# Patient Record
Sex: Female | Born: 1980 | Race: Black or African American | Hispanic: No | Marital: Single
Health system: Southern US, Community
[De-identification: ages and names within clinical notes are randomized; demographics above are authoritative.]

---

## 2020-07-30 ENCOUNTER — Emergency Department (HOSPITAL_COMMUNITY)
Admission: EM | Admit: 2020-07-30 | Discharge: 2020-07-30 | Disposition: A | Discharge status: Home / Self Care | Payer: Self-pay | Attending: Emergency Medicine | Admitting: Emergency Medicine

## 2020-07-30 ENCOUNTER — Emergency Department (HOSPITAL_COMMUNITY): Payer: Self-pay

## 2020-07-30 ENCOUNTER — Encounter (HOSPITAL_COMMUNITY): Payer: Self-pay | Admitting: Emergency Medicine

## 2020-07-30 ENCOUNTER — Other Ambulatory Visit: Payer: Self-pay

## 2020-07-30 DIAGNOSIS — Y900 Blood alcohol level of less than 20 mg/100 ml: Secondary | ICD-10-CM | POA: Insufficient documentation

## 2020-07-30 DIAGNOSIS — Z7982 Long term (current) use of aspirin: Secondary | ICD-10-CM | POA: Insufficient documentation

## 2020-07-30 DIAGNOSIS — R531 Weakness: Secondary | ICD-10-CM | POA: Insufficient documentation

## 2020-07-30 DIAGNOSIS — R4781 Slurred speech: Secondary | ICD-10-CM | POA: Insufficient documentation

## 2020-07-30 DIAGNOSIS — R2990 Unspecified symptoms and signs involving the nervous system: Secondary | ICD-10-CM

## 2020-07-30 DIAGNOSIS — I639 Cerebral infarction, unspecified: Secondary | ICD-10-CM | POA: Insufficient documentation

## 2020-07-30 LAB — DIFFERENTIAL
Abs Immature Granulocytes: 0.01 10*3/uL (ref 0.00–0.07)
Basophils Absolute: 0 10*3/uL (ref 0.0–0.1)
Basophils Relative: 0 %
Eosinophils Absolute: 0.3 10*3/uL (ref 0.0–0.5)
Eosinophils Relative: 6 %
Immature Granulocytes: 0 %
Lymphocytes Relative: 55 %
Lymphs Abs: 2.9 10*3/uL (ref 0.7–4.0)
Monocytes Absolute: 0.4 10*3/uL (ref 0.1–1.0)
Monocytes Relative: 8 %
Neutro Abs: 1.6 10*3/uL — ABNORMAL LOW (ref 1.7–7.7)
Neutrophils Relative %: 31 %

## 2020-07-30 LAB — COMPREHENSIVE METABOLIC PANEL
ALT: 13 U/L (ref 0–44)
AST: 30 U/L (ref 15–41)
Albumin: 3.4 g/dL — ABNORMAL LOW (ref 3.5–5.0)
Alkaline Phosphatase: 35 U/L — ABNORMAL LOW (ref 38–126)
Anion gap: 6 (ref 5–15)
BUN: 9 mg/dL (ref 6–20)
CO2: 24 mmol/L (ref 22–32)
Calcium: 8.8 mg/dL — ABNORMAL LOW (ref 8.9–10.3)
Chloride: 105 mmol/L (ref 98–111)
Creatinine, Ser: 0.75 mg/dL (ref 0.44–1.00)
GFR, Estimated: >60 mL/min (ref >60)
Glucose, Bld: 92 mg/dL (ref 70–99)
Potassium: 4.4 mmol/L (ref 3.5–5.1)
Sodium: 135 mmol/L (ref 135–145)
Total Bilirubin: 1.1 mg/dL (ref 0.3–1.2)
Total Protein: 6.7 g/dL (ref 6.5–8.1)

## 2020-07-30 LAB — I-STAT CHEM 8, ED
BUN: 9 mg/dL (ref 6–20)
Calcium, Ion: 1.11 mmol/L — ABNORMAL LOW (ref 1.15–1.40)
Chloride: 104 mmol/L (ref 98–111)
Creatinine, Ser: 0.7 mg/dL (ref 0.44–1.00)
Glucose, Bld: 90 mg/dL (ref 70–99)
HCT: 35 % — ABNORMAL LOW (ref 36.0–46.0)
Hemoglobin: 11.9 g/dL — ABNORMAL LOW (ref 12.0–15.0)
Potassium: 4.7 mmol/L (ref 3.5–5.1)
Sodium: 138 mmol/L (ref 135–145)
TCO2: 26 mmol/L (ref 22–32)

## 2020-07-30 LAB — CBC
HCT: 34.1 % — ABNORMAL LOW (ref 36.0–46.0)
Hemoglobin: 10.7 g/dL — ABNORMAL LOW (ref 12.0–15.0)
MCH: 29.5 pg (ref 26.0–34.0)
MCHC: 31.4 g/dL (ref 30.0–36.0)
MCV: 93.9 fL (ref 80.0–100.0)
Platelets: 286 10*3/uL (ref 150–400)
RBC: 3.63 MIL/uL — ABNORMAL LOW (ref 3.87–5.11)
RDW: 12.9 % (ref 11.5–15.5)
WBC: 5.3 10*3/uL (ref 4.0–10.5)
nRBC: 0 % (ref 0.0–0.2)

## 2020-07-30 LAB — I-STAT BETA HCG BLOOD, ED (MC, WL, AP ONLY): I-stat hCG, quantitative: <5 m[IU]/mL (ref <5)

## 2020-07-30 LAB — CBG MONITORING, ED: Glucose-Capillary: 91 mg/dL (ref 70–99)

## 2020-07-30 LAB — APTT: aPTT: 26 seconds (ref 24–36)

## 2020-07-30 LAB — ETHANOL: Alcohol, Ethyl (B): <10 mg/dL (ref <10)

## 2020-07-30 LAB — PROTIME-INR
INR: 1 (ref 0.8–1.2)
Prothrombin Time: 13.4 seconds (ref 11.4–15.2)

## 2020-07-30 MED ORDER — PROCHLORPERAZINE EDISYLATE 10 MG/2ML IJ SOLN
10.0000 mg | Freq: Once | INTRAMUSCULAR | Status: AC
  Administered 2020-07-30: 10 mg via INTRAVENOUS
  Filled 2020-07-30: qty 2

## 2020-07-30 MED ORDER — DIPHENHYDRAMINE HCL 50 MG/ML IJ SOLN
12.5000 mg | Freq: Once | INTRAMUSCULAR | Status: AC
  Administered 2020-07-30: 12.5 mg via INTRAVENOUS
  Filled 2020-07-30: qty 1

## 2020-07-30 MED ORDER — SODIUM CHLORIDE 0.9 % IV BOLUS
1000.0000 mL | Freq: Once | INTRAVENOUS | Status: AC
  Administered 2020-07-30: 1000 mL via INTRAVENOUS

## 2020-07-30 NOTE — ED Notes (Signed)
Pt verbalizes understanding of discharge instructions. Opportunity for questions and answers were provided. Pt discharged from the ED.   ?

## 2020-07-30 NOTE — ED Provider Notes (Signed)
[facility] EMERGENCY DEPARTMENT Provider Note   CSN: [id number] Arrival date & time: 07/30/20  1247  An emergency department physician performed an initial assessment on this suspected stroke patient at 56.  History Chief Complaint  Patient presents with   Code Stroke    Sarah Wang is a 40 y.o. female.  Patient here as a code stroke.  Left-sided weakness, speech issues.  History of stroke with left-sided deficits.  Recent admission at outside hospital for the same.  Code stroke upon arrival.  The history is provided by the patient.  Neurologic Problem This is a new problem. The current episode started less than 1 hour ago. The problem occurs constantly. The problem has not changed since onset.Pertinent negatives include no chest pain, no abdominal pain, no headaches and no shortness of breath. Nothing aggravates the symptoms. Nothing relieves the symptoms. She has tried nothing for the symptoms.      History reviewed. No pertinent past medical history.  There are no problems to display for this patient.   History reviewed. No pertinent surgical history.   OB History   No obstetric history on file.     No family history on file.     Home Medications Prior to Admission medications   Medication Sig Start Date End Date Taking? Authorizing Provider  aspirin 81 MG chewable tablet Chew 81 mg by mouth daily. 07/16/20 10/14/20 Yes [provider]  Cholecalciferol 25 MCG (1000 UT) tablet Take 1,000 Units by mouth daily. 06/30/20 09/28/20 Yes [provider]  cyanocobalamin 1000 MCG tablet Take 1,000 mcg by mouth daily. 06/30/20 09/28/20 Yes [provider]  ferrous sulfate 325 (65 FE) MG tablet Take 325 mg by mouth daily with breakfast. 06/30/20 09/28/20 Yes [provider]  simvastatin (ZOCOR) 20 MG tablet Take 20 mg by mouth at bedtime. 07/16/20 10/14/20 Yes [provider]  cyclobenzaprine (FLEXERIL) 5 MG tablet Take  5 mg by mouth 3 (three) times daily as needed for muscle spasms. Patient not taking: Reported on 07/30/2020 06/24/20   [provider]    Allergies    Reglan [metoclopramide] and Percocet [oxycodone-acetaminophen]  Review of Systems   Review of Systems  Constitutional:  Negative for chills and fever.  HENT:  Negative for ear pain and sore throat.   Eyes:  Negative for pain and visual disturbance.  Respiratory:  Negative for cough and shortness of breath.   Cardiovascular:  Negative for chest pain and palpitations.  Gastrointestinal:  Negative for abdominal pain and vomiting.  Genitourinary:  Negative for dysuria and hematuria.  Musculoskeletal:  Negative for arthralgias and back pain.  Skin:  Negative for color change and rash.  Neurological:  Positive for speech difficulty and weakness. Negative for dizziness, tremors, seizures, syncope, facial asymmetry, light-headedness, numbness and headaches.  All other systems reviewed and are negative.  Physical Exam Updated Vital Signs BP 108/83   Pulse 71   Temp 98.3 F (36.8 C) (Oral)   Resp 14   Ht 5\' 3"  (1.6 m)   Wt 63 kg   SpO2 99%   BMI 24.62 kg/m   Physical Exam Vitals and nursing note reviewed.  Constitutional:      General: She is not in acute distress.    Appearance: She is well-developed. She is not ill-appearing.  HENT:     Head: Normocephalic and atraumatic.     Right Ear: Tympanic membrane normal.     Left Ear: Tympanic membrane normal.     Nose:  Nose normal.     Mouth/Throat:     Mouth: Mucous membranes are moist.  Eyes:     Extraocular Movements: Extraocular movements intact.     Conjunctiva/sclera: Conjunctivae normal.     Pupils: Pupils are equal, round, and reactive to light.  Cardiovascular:     Rate and Rhythm: Normal rate and regular rhythm.     Pulses: Normal pulses.     Heart sounds: Normal heart sounds. No murmur heard. Pulmonary:     Effort: Pulmonary effort is normal. No respiratory  distress.     Breath sounds: Normal breath sounds.  Abdominal:     Palpations: Abdomen is soft.     Tenderness: There is no abdominal tenderness.  Musculoskeletal:        General: No tenderness. Normal range of motion.     Cervical back: Normal range of motion and neck supple.  Skin:    General: Skin is warm and dry.     Capillary Refill: Capillary refill takes less than 2 seconds.  Neurological:     General: No focal deficit present.     Mental Status: She is alert and oriented to person, place, and time.     Cranial Nerves: No cranial nerve deficit.     Sensory: No sensory deficit.     Comments: Poor effort given on exam but left-sided weakness compared to the right, slightly slurred speech, no obvious facial droop    ED Results / Procedures / Treatments   Labs (all labs ordered are listed, but only abnormal results are displayed) Labs Reviewed  CBC - Abnormal; Notable for the following components:      Result Value   RBC 3.63 (*)    Hemoglobin 10.7 (*)    HCT 34.1 (*)    All other components within normal limits  DIFFERENTIAL - Abnormal; Notable for the following components:   Neutro Abs 1.6 (*)    All other components within normal limits  COMPREHENSIVE METABOLIC PANEL - Abnormal; Notable for the following components:   Calcium 8.8 (*)    Albumin 3.4 (*)    Alkaline Phosphatase 35 (*)    All other components within normal limits  I-STAT CHEM 8, ED - Abnormal; Notable for the following components:   Calcium, Ion 1.11 (*)    Hemoglobin 11.9 (*)    HCT 35.0 (*)    All other components within normal limits  RESP PANEL BY RT-PCR (FLU A&B, COVID) ARPGX2  ETHANOL  PROTIME-INR  APTT  RAPID URINE DRUG SCREEN, HOSP PERFORMED  URINALYSIS, ROUTINE W REFLEX MICROSCOPIC  CBG MONITORING, ED  I-STAT BETA HCG BLOOD, ED ([facility], [facility], [facility] ONLY)    EKG EKG Interpretation  Date/Time:  Wednesday July 30 2020 13:30:49 EDT Ventricular Rate:  74 PR Interval:  123 QRS Duration: 97 QT  Interval:  395 QTC Calculation: 439 R Axis:   68 Text Interpretation: Sinus rhythm Confirmed by Virgina Norfolk ([id number]) on 07/30/2020 1:37:32 PM  Radiology MR BRAIN WO CONTRAST  Result Date: 07/30/2020 CLINICAL DATA:  Neuro deficit, acute, stroke suspected. Additional provided: Sudden onset slurred speech, prior TIA June 27th, patient reports residual left-sided arm and leg weakness with slurred speech. EXAM: MRI HEAD WITHOUT CONTRAST TECHNIQUE: Multiplanar, multiecho pulse sequences of the brain and surrounding structures were obtained without intravenous contrast. COMPARISON:  Non-contrast head CT 07/30/2020. FINDINGS: Brain: An abbreviated examination consisting of only axial and coronal diffusion-weighted imaging, and axial T2/FLAIR sequence, performed at the provider's request. Focal encephalomalacia is identified. No  significant white matter disease. There is no evidence of acute infarct. No evidence of an intracranial mass or extra-axial fluid collection on the acquired sequences. No midline shift. Vascular: Poorly assessed on the acquired sequences. Skull and upper cervical spine: No appreciable focal suspicious marrow lesion. Sinuses/Orbits: Unremarkable IMPRESSION: Tailored examination consisting of only diffusion-weighted and axial T2/FLAIR sequences at the provider's request. No evidence of acute infarct. Electronically Signed   By: Jackey Loge DO   On: 07/30/2020 13:38   CT HEAD CODE STROKE WO CONTRAST  Result Date: 07/30/2020 CLINICAL DATA:  Code stroke. Left-sided weakness and slurred speech. EXAM: CT HEAD WITHOUT CONTRAST TECHNIQUE: Contiguous axial images were obtained from the base of the skull through the vertex without intravenous contrast. COMPARISON:  None. FINDINGS: Brain: The brain has normal appearance without evidence of atrophy, old or acute infarction, mass lesion, hemorrhage, hydrocephalus or extra-axial collection. Vascular: No abnormal vascular finding. Skull: Normal  Sinuses/Orbits: Clear/normal Other: None ASPECTS (Alberta Stroke Program Early CT Score) - Ganglionic level infarction (caudate, lentiform nuclei, internal capsule, insula, M1-M3 cortex): 7 - Supraganglionic infarction (M4-M6 cortex): 3 Total score (0-10 with 10 being normal): 10 IMPRESSION: 1. Normal head CT. 2. ASPECTS is 10 3. These results were communicated to Dr. Iver Nestle at 12:59 pm on 07/30/2020 by text page via the [facility] messaging system. Electronically Signed   By: Paulina Fusi M.D.   On: 07/30/2020 12:59    Procedures Procedures   Medications Ordered in ED Medications  sodium chloride 0.9 % bolus 1,000 mL (1,000 mLs Intravenous New Bag/Given 07/30/20 1410)  prochlorperazine (COMPAZINE) injection 10 mg (10 mg Intravenous Given 07/30/20 1410)  diphenhydrAMINE (BENADRYL) injection 12.5 mg (12.5 mg Intravenous Given 07/30/20 1411)    ED Course  I have reviewed the triage vital signs and the nursing notes.  Pertinent labs & imaging results that were available during my care of the patient were reviewed by me and considered in my medical decision making (see chart for details).    MDM Rules/Calculators/A&P                          Sarah Wang is here as a code stroke.  Left-sided weakness, slurred speech.  Normal vitals.  No fever.  Overall exam appears to be unremarkable.  Somewhat functional as poor effort given.  Does not have consistent strength exam.  Appears to have some left-sided weakness when compared to the right but difficult to fully evaluate.  Chart review shows that she had a recent admission for possible TIA and revealed an overall unremarkable work-up.  Neurology at the bedside who took patient both the CT scan and MRI that were both normal.  No evidence of prior strokes.  Overall lab work was unremarkable.  No significant anemia, electrolyte abnormality, kidney injury.  EKG shows sinus rhythm.  Overall suspect likely a functional process.  Discharged in ED in good  condition.  Final Clinical Impression(s) / ED Diagnoses Final diagnoses:  Left-sided weakness    Rx / DC Orders ED Discharge Orders     None        Virgina Norfolk, DO 07/30/20 1425

## 2020-07-30 NOTE — Consult Note (Signed)
Neurology Consultation Reason for Consult: Code stroke Requesting Physician: Virgina Norfolk  CC: Left sided weakness   History is obtained from: Patient and EMS  HPI: Sarah Wang is a 40 y.o. 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  This was witnessed at about noon, EMS was activated and she was brought to [facility] for further evaluation.  She reports she has had 3 such episodes, the first in 2019, one about a month ago.  She denies any recent procedures, any history of intracerebral hemorrhage, any recent bleeding, any chance she could be pregnant, reports mild headache that is not unusual for her.  Initially she was new to our system but after records were merged from outside facilities she did have an admission on 06/29/2020 for left-sided weakness as well as in September 2019, with other presentations as well for neurologic symptoms such as vertigo and dysarthria.  Imaging has been negative in all cases  LKW: Noon tPA given?: No, no stroke on imaging and symptoms felt to be functional IA performed?: No, no LVO Premorbid modified rankin scale:      1 - No significant disability. Able to carry out all usual activities, despite some symptoms.  ROS: All other review of systems was negative except as noted in the HPI.   History reviewed. No pertinent past medical history.  History includes prior episodes of left-sided weakness stroke versus TIA versus functional neurological disorder, and hyperlipidemia, headaches  No family history on file. family history includes Hypertension in her brother and mother; Stroke in her brother; Thyroid disease in her father.  Social History:  has no history on file for  tobacco use, alcohol use, and drug use.   Exam: Current vital signs: There were no vitals taken for this visit. Vital signs in last 24 hours:     Physical Exam  Constitutional: Appears well-developed and well-nourished.  Psych: Affect flat Eyes: No scleral injection HENT: No oropharyngeal obstruction.  MSK: no joint deformities.  Cardiovascular: Normal rate and regular rhythm.  Respiratory: Effort normal, non-labored breathing GI: Soft.  No distension. There is no tenderness.  Skin: Warm dry and intact visible skin  Neuro: Mental Status: Patient is awake, alert, oriented to person, place, month, year, and situation. Patient is able to give a clear and coherent history. No signs of aphasia or neglect Cranial Nerves: II: Visual Fields are full. Pupils are equal, round, and reactive to light.   III,IV, VI: EOMI without ptosis or diploplia.  V: Facial sensation is symmetric to temperature VII: Facial movement is symmetric, there is an inconsistent intermittent right facial droop for me (was left sided on EMS evaluation).  VIII: hearing is intact to voice X: Uvula elevates symmetrically XI: Shoulder shrug is limited by patient effort. XII: tongue is midline without atrophy or fasciculations.  Motor: Tone is normal. Bulk is normal.  Her strength exam is inconsistent, the left upper extremity drifts straight down without pronation.  The legs go straight down when held straight up but when held above each other they move diagonally downwards.  When not being formally tested she moves her extremities spontaneously more than when formally tested Sensory: She reports the loss of sensation on the left face arm and leg Deep Tendon Reflexes: 2+ and symmetric  in the biceps and patellae. Plantars: Toes are mute bilaterally versus patient not relaxing Cerebellar: Could not perform on initial evaluation, later was able to perform  NIHSS total 7 Score breakdown: 2 points for left upper  extremity drift (functional in appearance), 3 points for left lower extremity drift (again functional), one-point for loss of sensation on the left 1 point for intermittent dysarthria   I have reviewed labs in epic and the results pertinent to this consultation are: Creatinine 0.75 Hemoglobin 10.7, normocytic anemia, otherwise CBC within normal limits, PT/INR within normal limits, beta-hCG negative, glucose 92, ethanol level undetectable  I have reviewed the images obtained: Head CT personally reviewed, normal MRI brain limited sequences obtained (diffusion and FLAIR), mild microvascular changes around the occipital horns of the lateral ventricles, otherwise negative for acute or chronic process  Impression: 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  Imaging reassuring against emergent acute process which would require admission for further work-up.  Given the chronicity of her symptoms, appropriate for continued outpatient follow-up  Recommendations: -MRI brain completed on my recommendation, exclude acute stroke -Further follow-up with outpatient neurology and psychiatry   Brooke Dare MD-PhD [facility] [phone] Available 7 AM to 7 PM, outside these hours please contact Neurologist on call listed on AMION

## 2020-07-30 NOTE — ED Triage Notes (Signed)
Pt BIB [facility] for code stroke. Per family pt was talking to family at 1200 when pt had sudden onset slurred speech. Pt had a TIA on 6/27 and family reports residual L sided arm and leg weakness. Family reports worsening L arm and L leg weakness that started today with slurred speech.   EMS VS- 144/72, HR 80, 99% RA, 88 CBG

## 2020-07-30 NOTE — Code Documentation (Signed)
Stroke Response Nurse Documentation Code Documentation  Sarah Wang is a 40 y.o. female arriving to [facility] ED via [facility] on 07/30/2020 with past medical hx of TIA. Code stroke was activated by EMS. Patient from home where she was LKW at 1200 and now complaining of left sided weakness and slurred speech. Patient reports history of left arm and leg weakness that has worsened today with new slurred speech.  Stroke team at the bedside on patient arrival. Labs drawn and patient cleared for CT.  Patient to CT with team. NIHSS 5, see documentation for details and code stroke times. Patient with left arm weakness, left leg weakness, left decreased sensation, and dysarthria  on exam. Exam inconsistent with left sided strength differing. The following imaging was completed:  CT and patient taken to MRI. Patient is not a candidate for tPA due to stroke not suspected.   Bedside handoff with ED RN Camryn.    Lucila Maine  Stroke Response RN

## 2023-02-06 IMAGING — CT CT HEAD CODE STROKE
4 series · 17 of 47 positions shown, 19 images · non-contrast
Comparison: None.

CLINICAL DATA: Code stroke. Left-sided weakness and slurred speech.

EXAM:
CT HEAD WITHOUT CONTRAST
TECHNIQUE: Contiguous axial images were obtained from the base of the skull
through the vertex without intravenous contrast.

[Series 3: head wo · axial · 0.41mm/px · z∈[-113,+7]mm · 7 of 34 slices shown, 9 images]
[im 5/34  brain]
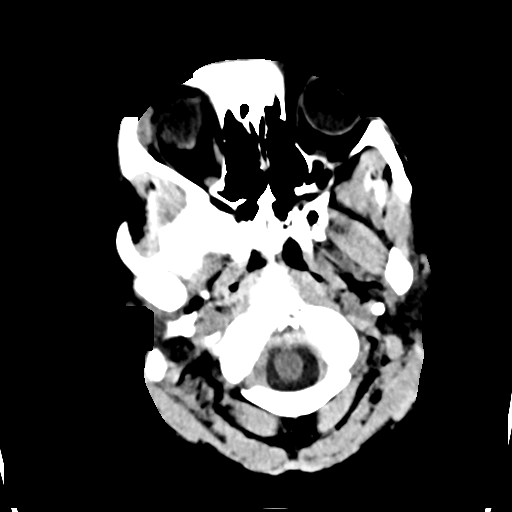
[im 5/34  bone]
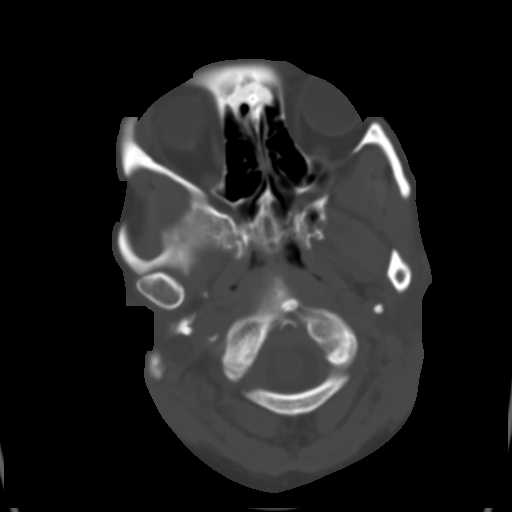
[im 9/34  brain]
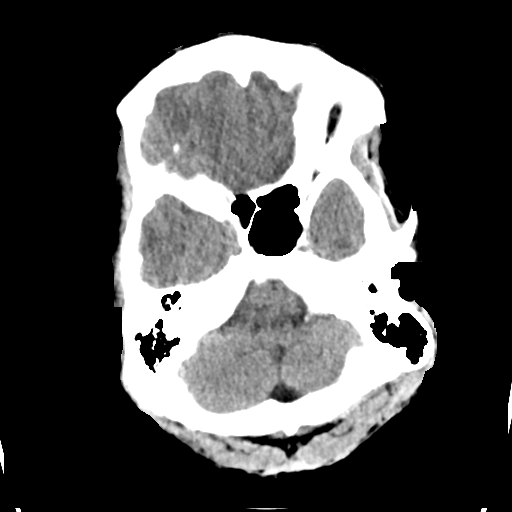
[im 13/34  brain]
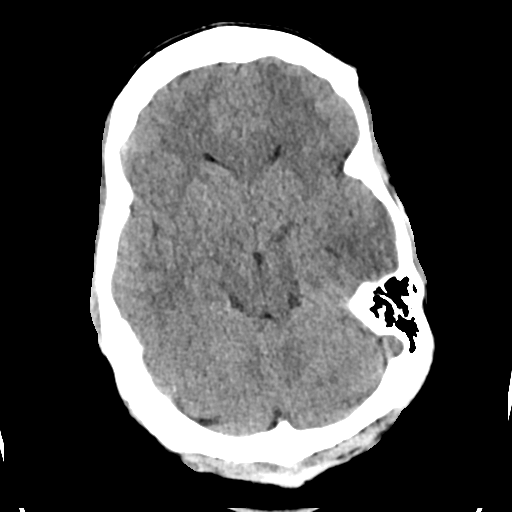
[im 17/34  brain]
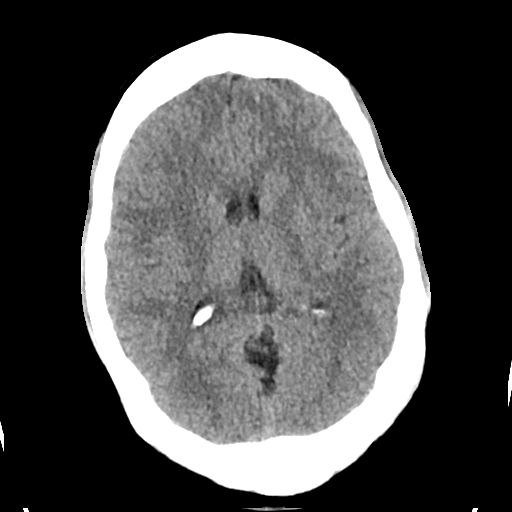
[im 21/34  brain]
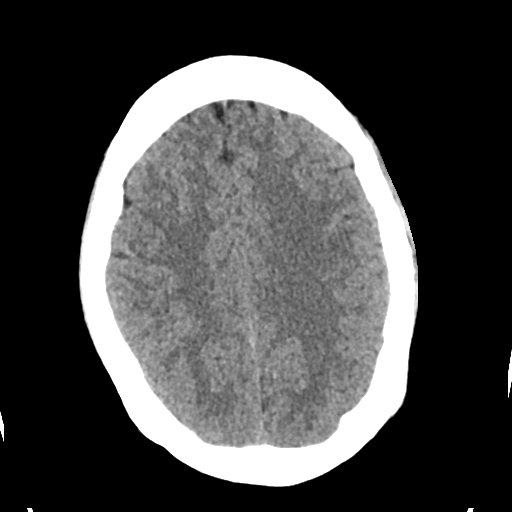
[im 21/34  bone]
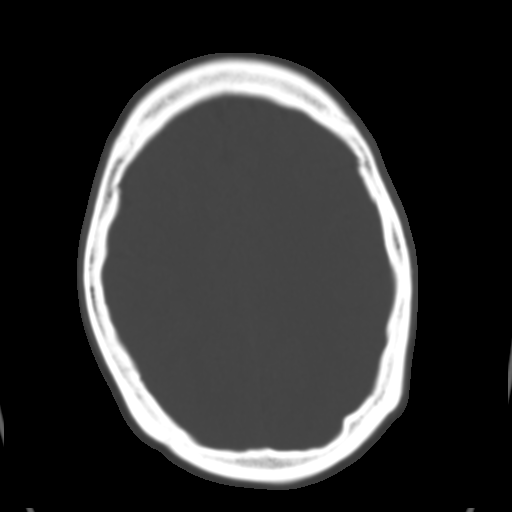
[im 25/34  brain]
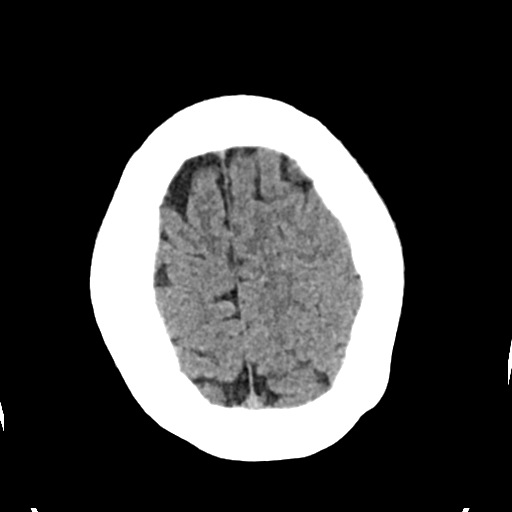
[im 29/34  brain]
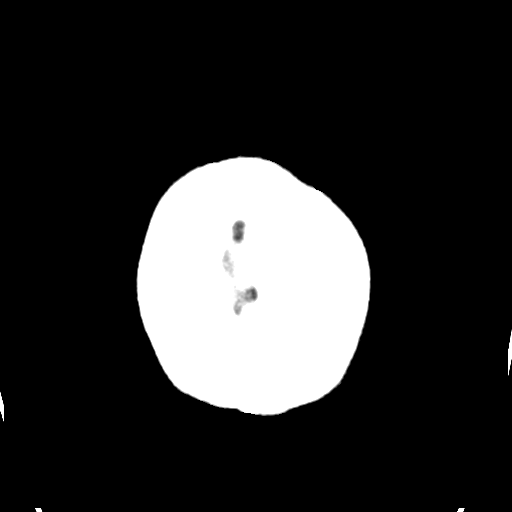

[Series 4: head bone · axial · 0.41mm/px · z∈[-117,-59]mm · 4 of 84 slices shown]
[im 9/84  bone]
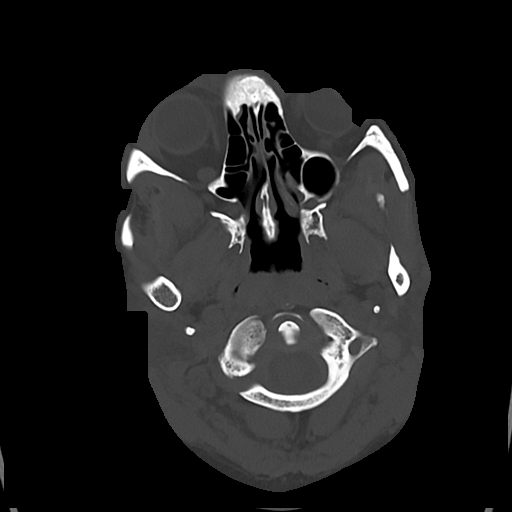
[im 17/84  bone]
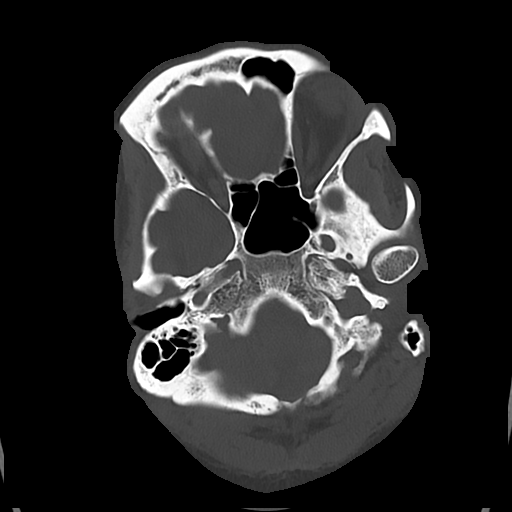
[im 25/84  bone]
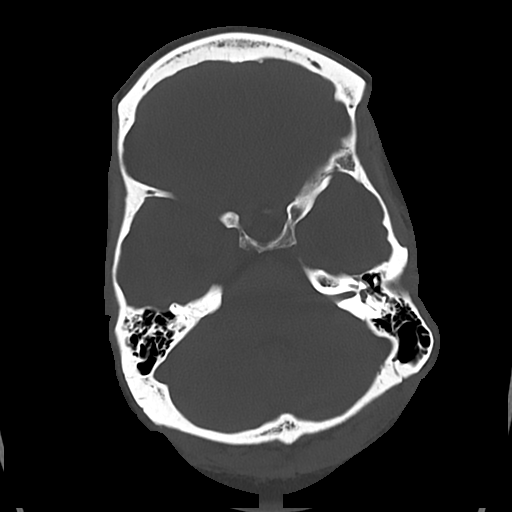
[im 38/84  bone]
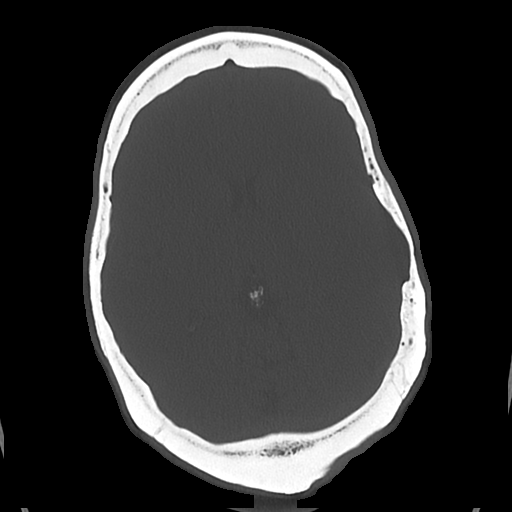

[Series 5: cor soft · coronal · 0.33mm/px · 3 of 74 slices shown]
[im 25/74  brain]
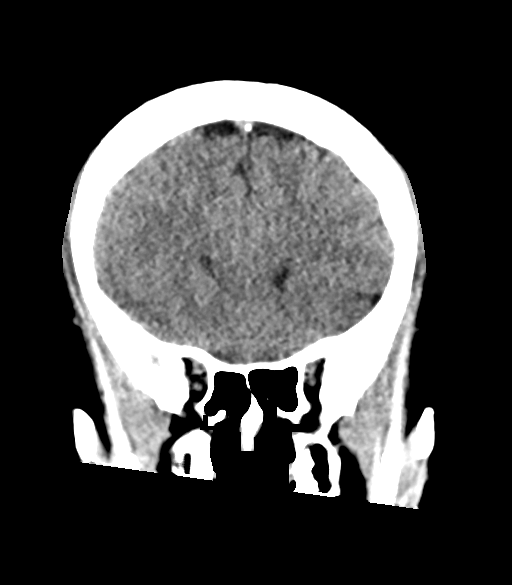
[im 33/74  brain]
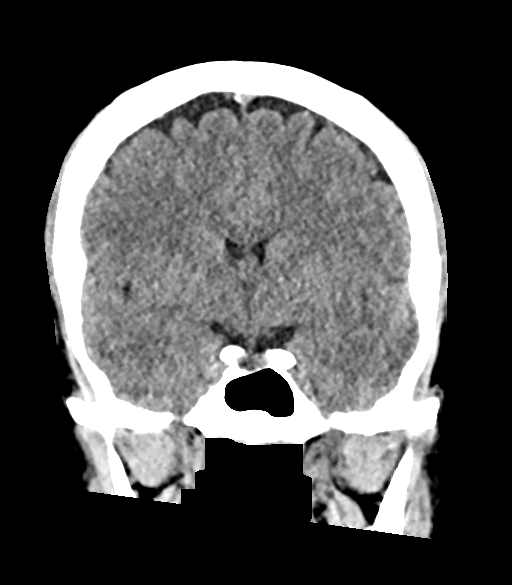
[im 41/74  brain]
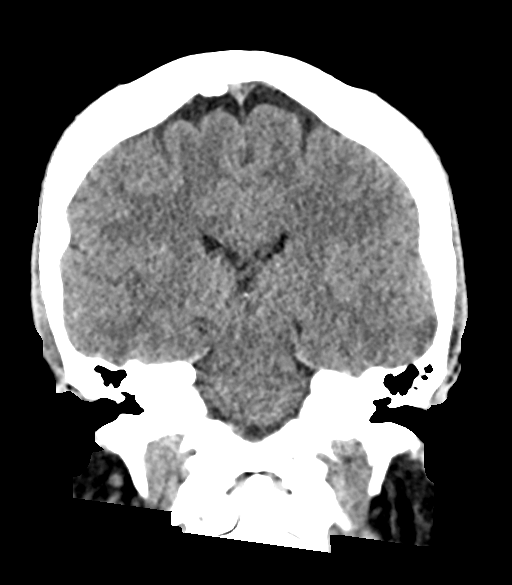

[Series 6: sag soft · sagittal · 0.44mm/px · 3 of 55 slices shown]
[im 19/55  brain]
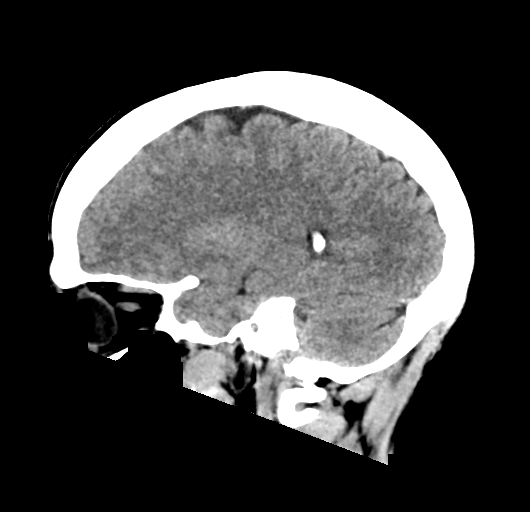
[im 28/55  brain]
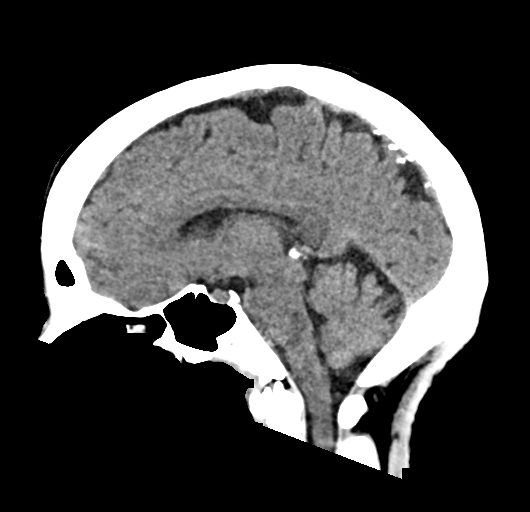
[im 36/55  brain]
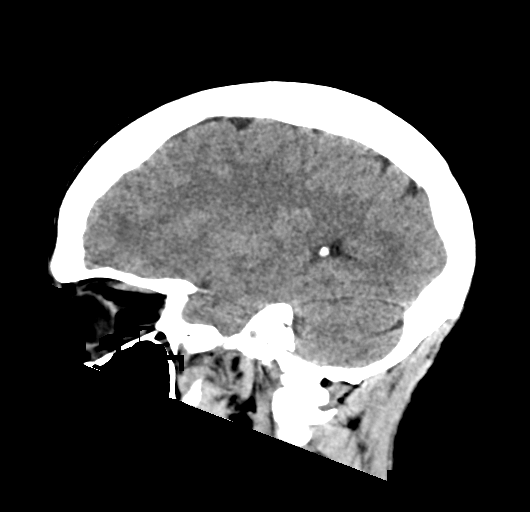

[17 of 47 positions shown; findings below may reference images not displayed]

FINDINGS: Brain: The brain has normal appearance without evidence of atrophy,
old or acute infarction, mass lesion, hemorrhage, hydrocephalus or
extra-axial collection.

Vascular: No abnormal vascular finding.

Skull: Normal

Sinuses/Orbits: Clear/normal

Other: None

ASPECTS (Alberta Stroke Program Early CT Score)

- Ganglionic level infarction (caudate, lentiform nuclei, internal
capsule, insula, M1-M3 cortex): 7

- Supraganglionic infarction (M4-M6 cortex): 3

Total score (0-10 with 10 being normal): 10
IMPRESSION: 1. Normal head CT.
2. ASPECTS is 10
3. These results were communicated to Dr. Isma at [DATE] on
07/30/2020 by text page via the AMION messaging system.
